# Patient Record
Sex: Male | Born: 1935 | Race: White | Hispanic: No | Marital: Married | Smoking: Former smoker
Health system: Southern US, Community
[De-identification: ages and names within clinical notes are randomized; demographics above are authoritative.]

## PROBLEM LIST (undated history)

## (undated) DIAGNOSIS — K219 Gastro-esophageal reflux disease without esophagitis: Secondary | ICD-10-CM

## (undated) DIAGNOSIS — H409 Unspecified glaucoma: Secondary | ICD-10-CM

## (undated) DIAGNOSIS — E785 Hyperlipidemia, unspecified: Secondary | ICD-10-CM

## (undated) DIAGNOSIS — I639 Cerebral infarction, unspecified: Secondary | ICD-10-CM

## (undated) DIAGNOSIS — M549 Dorsalgia, unspecified: Secondary | ICD-10-CM

## (undated) DIAGNOSIS — I4891 Unspecified atrial fibrillation: Secondary | ICD-10-CM

## (undated) DIAGNOSIS — I509 Heart failure, unspecified: Secondary | ICD-10-CM

## (undated) DIAGNOSIS — M1990 Unspecified osteoarthritis, unspecified site: Secondary | ICD-10-CM

## (undated) DIAGNOSIS — I10 Essential (primary) hypertension: Secondary | ICD-10-CM

## (undated) DIAGNOSIS — C801 Malignant (primary) neoplasm, unspecified: Secondary | ICD-10-CM

## (undated) DIAGNOSIS — I350 Nonrheumatic aortic (valve) stenosis: Secondary | ICD-10-CM

## (undated) DIAGNOSIS — N200 Calculus of kidney: Secondary | ICD-10-CM

## (undated) HISTORY — DX: Unspecified atrial fibrillation: I48.91

## (undated) HISTORY — DX: Gastro-esophageal reflux disease without esophagitis: K21.9

## (undated) HISTORY — DX: Heart failure, unspecified: I50.9

## (undated) HISTORY — PX: TRANSURETHRAL RESECTION OF PROSTATE: SHX73

## (undated) HISTORY — DX: Calculus of kidney: N20.0

## (undated) HISTORY — PX: CATARACT EXTRACTION: SUR2

## (undated) HISTORY — PX: TONSILLECTOMY: SUR1361

## (undated) HISTORY — DX: Hyperlipidemia, unspecified: E78.5

## (undated) HISTORY — DX: Essential (primary) hypertension: I10

## (undated) HISTORY — DX: Dorsalgia, unspecified: M54.9

## (undated) HISTORY — DX: Unspecified glaucoma: H40.9

## (undated) HISTORY — DX: Malignant (primary) neoplasm, unspecified: C80.1

## (undated) HISTORY — DX: Cerebral infarction, unspecified: I63.9

## (undated) HISTORY — PX: CHOLECYSTECTOMY: SHX55

## (undated) HISTORY — DX: Nonrheumatic aortic (valve) stenosis: I35.0

## (undated) HISTORY — DX: Unspecified osteoarthritis, unspecified site: M19.90

---

## 2004-10-18 ENCOUNTER — Ambulatory Visit: Payer: Self-pay | Admitting: Gastroenterology

## 2007-01-20 ENCOUNTER — Ambulatory Visit: Payer: Self-pay | Admitting: Gastroenterology

## 2010-01-30 ENCOUNTER — Inpatient Hospital Stay: Payer: Self-pay | Admitting: Internal Medicine

## 2011-03-18 ENCOUNTER — Ambulatory Visit: Payer: Self-pay | Admitting: Internal Medicine

## 2011-04-03 ENCOUNTER — Ambulatory Visit: Payer: Self-pay | Admitting: Vascular Surgery

## 2011-04-12 ENCOUNTER — Inpatient Hospital Stay: Payer: Self-pay | Admitting: Vascular Surgery

## 2011-04-17 LAB — PATHOLOGY REPORT

## 2012-04-09 ENCOUNTER — Ambulatory Visit: Payer: Self-pay | Admitting: Internal Medicine

## 2013-10-15 ENCOUNTER — Ambulatory Visit: Payer: Self-pay | Admitting: Internal Medicine

## 2014-09-29 DIAGNOSIS — I639 Cerebral infarction, unspecified: Secondary | ICD-10-CM

## 2014-09-29 HISTORY — DX: Cerebral infarction, unspecified: I63.9

## 2014-10-21 ENCOUNTER — Emergency Department: Payer: Self-pay | Admitting: Emergency Medicine

## 2014-10-21 LAB — URINALYSIS, COMPLETE
BLOOD: NEGATIVE
Bacteria: NONE SEEN
Bilirubin,UR: NEGATIVE
Glucose,UR: NEGATIVE mg/dL (ref 0–75)
Leukocyte Esterase: NEGATIVE
NITRITE: NEGATIVE
PH: 7 (ref 4.5–8.0)
Protein: NEGATIVE
SPECIFIC GRAVITY: 1.013 (ref 1.003–1.030)
SQUAMOUS EPITHELIAL: NONE SEEN
WBC UR: 1 /HPF (ref 0–5)

## 2014-10-21 LAB — CK: CK, TOTAL: 236 U/L

## 2014-10-21 LAB — CBC
HCT: 41.8 % (ref 40.0–52.0)
HGB: 13.6 g/dL (ref 13.0–18.0)
MCH: 32 pg (ref 26.0–34.0)
MCHC: 32.5 g/dL (ref 32.0–36.0)
MCV: 99 fL (ref 80–100)
Platelet: 189 10*3/uL (ref 150–440)
RBC: 4.24 10*6/uL — ABNORMAL LOW (ref 4.40–5.90)
RDW: 12.6 % (ref 11.5–14.5)
WBC: 14.1 10*3/uL — ABNORMAL HIGH (ref 3.8–10.6)

## 2014-10-21 LAB — PROTIME-INR
INR: 2.1
Prothrombin Time: 23 secs — ABNORMAL HIGH (ref 11.5–14.7)

## 2014-10-21 LAB — APTT: Activated PTT: 36 secs — ABNORMAL HIGH (ref 23.6–35.9)

## 2014-10-21 LAB — LIPASE, BLOOD: LIPASE: 133 U/L (ref 73–393)

## 2014-10-21 LAB — TROPONIN I: Troponin-I: <0.02 ng/mL

## 2014-10-21 LAB — HEPATIC FUNCTION PANEL A (ARMC): BILIRUBIN DIRECT: 0.9 mg/dL — AB (ref 0.0–0.2)

## 2014-10-25 LAB — COMPREHENSIVE METABOLIC PANEL
AST: 28 U/L (ref 15–37)
Albumin: 3.7 g/dL (ref 3.4–5.0)
Alkaline Phosphatase: 90 U/L
Anion Gap: 8 (ref 7–16)
BUN: 11 mg/dL (ref 7–18)
Bilirubin,Total: 2.4 mg/dL — ABNORMAL HIGH (ref 0.2–1.0)
CALCIUM: 8.5 mg/dL (ref 8.5–10.1)
CHLORIDE: 102 mmol/L (ref 98–107)
CO2: 27 mmol/L (ref 21–32)
CREATININE: 0.71 mg/dL (ref 0.60–1.30)
EGFR (African American): >60
EGFR (Non-African Amer.): >60
GLUCOSE: 103 mg/dL — AB (ref 65–99)
Osmolality: 273 (ref 275–301)
Potassium: 3.5 mmol/L (ref 3.5–5.1)
SGPT (ALT): 23 U/L
Sodium: 137 mmol/L (ref 136–145)
Total Protein: 6 g/dL — ABNORMAL LOW (ref 6.4–8.2)

## 2014-10-27 DIAGNOSIS — I482 Chronic atrial fibrillation: Secondary | ICD-10-CM

## 2014-10-27 DIAGNOSIS — S06360A Traumatic hemorrhage of cerebrum, unspecified, without loss of consciousness, initial encounter: Secondary | ICD-10-CM

## 2014-10-27 DIAGNOSIS — E871 Hypo-osmolality and hyponatremia: Secondary | ICD-10-CM

## 2014-10-31 DIAGNOSIS — E871 Hypo-osmolality and hyponatremia: Secondary | ICD-10-CM

## 2014-10-31 DIAGNOSIS — F0150 Vascular dementia without behavioral disturbance: Secondary | ICD-10-CM

## 2014-10-31 DIAGNOSIS — I480 Paroxysmal atrial fibrillation: Secondary | ICD-10-CM

## 2014-10-31 DIAGNOSIS — I619 Nontraumatic intracerebral hemorrhage, unspecified: Secondary | ICD-10-CM

## 2014-10-31 DIAGNOSIS — E785 Hyperlipidemia, unspecified: Secondary | ICD-10-CM

## 2014-10-31 DIAGNOSIS — I10 Essential (primary) hypertension: Secondary | ICD-10-CM

## 2014-12-06 DIAGNOSIS — I480 Paroxysmal atrial fibrillation: Secondary | ICD-10-CM

## 2014-12-06 DIAGNOSIS — F0150 Vascular dementia without behavioral disturbance: Secondary | ICD-10-CM

## 2014-12-06 DIAGNOSIS — I10 Essential (primary) hypertension: Secondary | ICD-10-CM

## 2014-12-06 DIAGNOSIS — I639 Cerebral infarction, unspecified: Secondary | ICD-10-CM

## 2014-12-06 DIAGNOSIS — E785 Hyperlipidemia, unspecified: Secondary | ICD-10-CM

## 2014-12-14 DIAGNOSIS — J069 Acute upper respiratory infection, unspecified: Secondary | ICD-10-CM

## 2014-12-27 DIAGNOSIS — J209 Acute bronchitis, unspecified: Secondary | ICD-10-CM

## 2015-01-03 ENCOUNTER — Telehealth: Payer: Self-pay

## 2015-01-03 NOTE — Telephone Encounter (Signed)
Jim at [facility] left v/m; pt is new pt at [facility]; on FL2 has brimonidine eye gtts listed; what strength should pt receive 0.15 or 0.2. Jim request cb.

## 2015-01-04 NOTE — Telephone Encounter (Signed)
I am not sure He probably needs to check with the eye doctor Can also call [facility] to see what they were giving him while he was in [facility] Let him know that I am not his physician since leaving [facility]

## 2015-01-04 NOTE — Telephone Encounter (Signed)
Spoke with Clair Gulling at [place] and advised results.

## 2015-01-30 HISTORY — PX: PTCA: SHX146

## 2015-02-13 ENCOUNTER — Ambulatory Visit: Payer: Self-pay | Admitting: Vascular Surgery

## 2015-03-06 ENCOUNTER — Inpatient Hospital Stay: Payer: Self-pay | Admitting: Internal Medicine

## 2015-03-08 ENCOUNTER — Ambulatory Visit: Payer: Medicare Other | Admitting: Internal Medicine

## 2015-03-22 ENCOUNTER — Ambulatory Visit: Payer: Self-pay | Admitting: Family

## 2015-04-24 ENCOUNTER — Ambulatory Visit
Admit: 2015-04-24 | Disposition: A | Discharge status: Home / Self Care | Payer: Self-pay | Attending: Family | Admitting: Family

## 2015-04-25 DIAGNOSIS — I5022 Chronic systolic (congestive) heart failure: Secondary | ICD-10-CM | POA: Insufficient documentation

## 2015-04-25 DIAGNOSIS — I482 Chronic atrial fibrillation, unspecified: Secondary | ICD-10-CM

## 2015-04-25 DIAGNOSIS — I4891 Unspecified atrial fibrillation: Secondary | ICD-10-CM | POA: Insufficient documentation

## 2015-04-25 DIAGNOSIS — I5032 Chronic diastolic (congestive) heart failure: Secondary | ICD-10-CM | POA: Insufficient documentation

## 2015-04-30 NOTE — Consult Note (Signed)
Present Illness 81 year old male with history of atrial fibrillation previously anticoagulated  until her AAA hemorrhagic stroke the caused this to be discontinued.  Two weeks ago he underwent percutaneous intervention the of her lower extremity lesion.  The he is a [facility] of a home place.  He was readmitted after being noted to have progressive shortness of breath and lower extremity edema.   since admission, he appears to be fairly asymptomatic.  His lower extremity edema has improved with diuresis.  He denies shortness of breath or chest pain.  Patient has a history of moderate aortic stenosis and echocardiogram done during this admission reveals a valve area is approximately 1.0 cm2 consistent with moderate aortic stenosis.  His ejection fraction is approximately  45-50%.  He has moderate aortic stenosis.  Patient has improved with diuresis.  He is resting comfortably flat in bed with no evidence of heart failure.  Chest x-ray revealed mild volume overload.  He denies any change in his medications or diet   Physical Exam:  GEN well nourished, no acute distress   HEENT PERRL   NECK No masses   RESP normal resp effort  rhonchi   CARD Irregular rate and rhythm  Murmur   Murmur Systolic   Systolic Murmur Out flow  2 to 3+ crescendo decrescendo systolic murmur radiating to the outflow tract   ABD denies tenderness  no Adominal Mass   LYMPH negative neck, negative axillae   EXTR negative cyanosis/clubbing, positive edema, bilateral lower extremity pitting edema 2 to 3+   SKIN normal to palpation   NEURO cranial nerves intact, motor/sensory function intact   PSYCH A+O to time, place, person   Review of Systems:  Subjective/Chief Complaint some shortness of breath and lower extremity edema   General: Weakness   Skin: No Complaints   ENT: No Complaints   Eyes: No Complaints   Neck: No Complaints   Respiratory: Short of breath   Cardiovascular: Edema   Gastrointestinal:  No Complaints   Genitourinary: No Complaints   Vascular: No Complaints   Musculoskeletal: No Complaints   Neurologic: No Complaints   Hematologic: No Complaints   Endocrine: No Complaints   Psychiatric: No Complaints   Review of Systems: All other systems were reviewed and found to be negative   Medications/Allergies Reviewed Medications/Allergies reviewed   Family & Social History:  Family and Social History:  Family History Non-Contributory   EKG:  Interpretation atrial fibrillation with variable ventricular response    [place]: Alt Ment Status   Impression 81 year old male with history of mild cardiomyopathy and moderate aortic stenosis who was admitted after being noted to have peripheral edema.  Patient recently underwent percutaneous intervention of  his lower extremity.  He had a history of atrial fibrillation and previously is anticoagulated  a hemorrhagic stroke resulted in discontinuing of this.  He has had no neurologic sequelae since that time.  His lower extremity edema has improved with diuresis.  He currently appears to be at his baseline.  Echocardiogram shows mild reduced LV function with evidence of septal hypokinesis.  He also has a history of aortic stenosis with moderate aortic stenosis noted on this echo.  He has ruled out for myocardial infarction.  Given his relative stability, would continue to carefully diurese and is stable on am consider discharge back to  home place.   Plan 1. Continue with careful diuresis following renal function and hemodynamics 2. Continue to rate control his atrial fibrillation.  Not a candidate for  chronic anticoagulation due to history of hemorrhagic stroke 3. low-sodium diet 4. Daily weights at his place of residence 5. continue with dual anti-platelet therapy   Electronic Signatures: Teodoro Spray (MD)  (Signed 08-Mar-16 14:06)  Authored: General Aspect/Present Illness, History and Physical Exam, Review of System,  Family & Social History, EKG , Allergies, Impression/Plan   Last Updated: 08-Mar-16 14:06 by Teodoro Spray (MD)

## 2015-04-30 NOTE — Op Note (Signed)
PATIENT NAME:  Derek Novak, CORELLA MR#:  [id number] DATE OF BIRTH:  October 04, 1922  DATE OF PROCEDURE:  02/13/2015   PREOPERATIVE DIAGNOSES:  1.  Peripheral arterial disease with ulceration, right great toe.  2.  Right toe ulceration.  3.  Hypertension.  4.  Hyperlipidemia.  5.  Valvular heart disease.  POSTOPERATIVE DIAGNOSES:    1.  Peripheral arterial disease with ulceration, right great toe.  2.  Right toe ulceration.  3.  Hypertension.  4.  Hyperlipidemia.  5.  Valvular heart disease.   PROCEDURES:  1.  Ultrasound guidance for vascular access to left femoral artery.  2.  Catheter placement to right anterior tibial and right peroneal arteries from left femoral approach.  3.  Aortogram and selective right lower extremity angiogram.  4.  Percutaneous transluminal angioplasty of long segment stenosis and occlusion of the right anterior tibial artery with 3 mm diameter angioplasty balloon.  5.  Percutaneous transluminal angioplasty of right tibioperoneal trunk and proximal and mid peroneal artery with 3 mm diameter angioplasty balloon.  6.  Percutaneous transluminal angioplasty of right distal superficial femoral artery with 6 mm diameter drug-coated angioplasty balloon, 7 and 8 mm diameter conventional angioplasty balloon.  7.  Self-expanding stent placement to distal right superficial femoral artery for greater than 50% residual stenosis after angioplasty with 7 mm diameter x 6 cm in length self-expanding stent, post dilated with a 7 mm balloon.  8.  StarClose closure device, left femoral artery.   SURGEON: Algernon Huxley, MD   ANESTHESIA: Local with moderate conscious sedation.   ESTIMATED BLOOD LOSS: 25 mL.   INDICATION FOR PROCEDURE:  This is a 81 year old gentleman who saw me in the office last week with a nonhealing ulceration of the right great toe.  This started after minimal trauma in the area, and it was not healing promptly.  No palpable pedal pulses were seen and he was referred to  our office where he was found to have an ABI of 0.4.  He is brought in for angiography for further evaluation and potential treatment.  Risks and benefits were discussed.  Informed consent was obtained.   DESCRIPTION OF PROCEDURE: The patient is brought to the vascular suite. Groins were shaved and prepped, and a sterile surgical field was created. The left femoral head was localized with fluoroscopy. The left femoral artery is visualized on ultrasound and found to be patent. It was then accessed under direct ultrasound guidance without difficulty with a Seldinger needle and a permanent image was recorded. A J-wire and 5 French sheath were placed.  Pigtail catheter was placed in the aorta at the L1 level.   AP aortogram was performed. This showed what appeared to be mild to moderate left renal artery stenosis, patent right renal artery. The aorta or iliac segments were widely patent. The aorta was slightly ectatic. I then crossed the area of bifurcation and advanced to the right femoral head and selective right lower extremity angiogram was then performed.  This showed a diffusely calcific superficial femoral, profunda femoris and common femoral artery, but without high-grade stenosis in the proximal portion of the SFA without stenosis in the common femoral or profunda femoris artery.  The distal SFA had an occlusion over about a 10 cm segment, then reconstituted Hunter's canal in the below-knee popliteal artery.  The tibial trifurcation had a normal initial orientation. However, the posterior tibial artery was chronically occluded and not visualized. The peroneal artery was occluded proximally and the tibioperoneal trunk had  multiple areas of high-grade stenosis. The anterior tibial artery also had multiple areas of stenoses and occlusion throughout the proximal and mid segments, although it was the largest runoff vessel distally.  The patient was heparinized. A 6 French Ansell sheath was placed over a Truman  advantage wire. I was able to cross the SFA occlusion easily with the [facility] advantage wire and a Kumpe catheter and confirm intraluminal flow in the popliteal artery. I then turned my attention to the tibial arteries. I exchanged for an Advantage 0.018 wire. I first got into the anterior tibial artery and crossed in multiple areas of stenoses and occlusion and got all the way into the foot. Angioplasty was then performed from the distal anterior tibial artery at ankle all the way up to beyond the origin anterior tibial artery and popliteal artery with a 3 mm diameter angioplasty balloon and multiple areas of narrowing were seen with balloon inflation, which resolved with angioplasty. Completion angiogram following this showed markedly improved flow with in-line flow into the anterior tibial artery to the foot. I then turned my attention to the peroneal artery. This was cannulated separately with a 135 angled catheter and the Advantage 0.018 wire.  I was able to cross the occlusion. The stenoses in the tibioperoneal trunk and the occlusion in the peroneal artery and park the wire into the distal peroneal artery at the ankle. The 3 mm diameter angioplasty balloon was inflated in the proximal and mid peroneal artery, up through the tibioperoneal trunk into the distal popliteal artery.  Again, narrowing was seen in multiple locations which resolved with angioplasty and completion angiogram showed excellent in-line flow through both the peroneal and anterior tibial arteries without significant residual stenosis in either vessel and markedly improved perfusion distally.   I turned my attention to the distal SFA lesion.  I initially treated with a 6 mm diameter drug-coated angioplasty balloon with a high-grade residual stenosis at the proximal occlusion spot. I tried upsizing to a 7 and even an 8 mm diameter angioplasty balloon, but the calcific lesion remained and a greater than 50% residual stenosis remained.  A 7 mm  diameter x 8 cm length self-expanding stent was then deployed, post dilated with a 7 mm balloon with an excellent angiographic completion result and no residual stenosis identified.  At this point, I elected to terminate the procedure. The sheath was removed.  StarClose closure device was deployed in the usual fashion with excellent hemostatic result. The patient tolerated the procedure well and was taken to the recovery room in stable condition.      ____________________________ Algernon Huxley, MD jsd:DT D: 02/13/2015 16:46:40 ET T: 02/13/2015 17:30:38 ET JOB#: [id number]  cc: Algernon Huxley, MD, <Dictator> Algernon Huxley MD ELECTRONICALLY SIGNED 02/22/2015 16:37

## 2015-04-30 NOTE — Discharge Summary (Signed)
PATIENT NAME:  Derek Novak, Derek Novak MR#:  [id number] DATE OF BIRTH:  28-Aug-1922  DATE OF ADMISSION:  03/06/2015 DATE OF DISCHARGE:  03/08/2015  FINAL DIAGNOSES:  1. Acute on chronic left-sided diastolic congestive heart failure.  2. Hypertension.  3. Moderate aortic valvular stenosis.  4. Atrial fibrillation.  5. Peripheral vascular disease.  6. Osteoarthritis.  7. History of intracranial hemorrhage.  8. History of glaucoma.  9. History of renal stones.  10. Gastroesophageal reflux disease.   HISTORY AND PHYSICAL: Please see dictated admission history and physical.   Speers: The patient was admitted with increasing edema, shortness of breath, and evidence of acute worsening of congestive heart failure. Prior echocardiogram had shown diastolic dysfunction, with mild to moderate aortic stenosis, this from October 2015. Cardiac enzymes were followed, which were negative. Echocardiogram revealed LVEF of 50-55%, with unchanged mild valvular stenosis. He was diuresed aggressively and did very well with this, weaned off of oxygen. Physical therapy worked with the patient and he ambulated approximately 90 feet with a rolling walker with saturations stable, which he states is approximately baseline. He had just finished up home health physical therapy at his assisted living facility.   Family members felt that he was doing well, back to his baseline level of functioning, and so at this time he will be discharged to assisted living in stable condition with his physical activity up with a rolling walker as tolerated. He should be on 2 gram sodium diet. It is recommended that he weigh himself daily, calling for more than 2 pounds gain in weight in 1 day or 5 pound gain in 1 week or increasing signs or symptoms of heart failure. He will follow up in our office within the next 1 week.   DISCHARGE MEDICATIONS:  1. Brimonidine ophthalmic solution 0.15%, 1 drop to each eye 2 times a day.   2. Tylenol 1000 mg p.o. q. 6 hours as needed for pain or fever, maximum 2000 mg daily.  3. Aspirin 81 mg p.o. daily.  4. Omeprazole 40 mg p.o. daily.  5. PreserVision 1 p.o. daily.  6. Metoprolol succinate 25 mg, half tablet p.o. b.i.d.  7. Atorvastatin 40 mg p.o. daily.  8. Losartan 50 mg p.o. daily.  9. Furosemide 40 mg p.o. daily as needed for edema.  10. Colace 100 mg p.o. b.i.d.  11. Potassium 10 mEq p.o. daily as needed with furosemide.  12.       Finasteride 5 mg p.o. daily  He will stop Kaopectate, loperamide, amlodipine, and lisinopril.    ____________________________ Adin Hector, MD bjk:bu D: 03/08/2015 12:32:23 ET T: 03/08/2015 21:19:49 ET JOB#: [id number]  cc: Adin Hector, MD, <Dictator> Ramonita Lab MD ELECTRONICALLY SIGNED 03/10/2015 8:46

## 2015-04-30 NOTE — H&P (Signed)
PATIENT NAME:  Derek Novak, Derek Novak MR#:  [id number] DATE OF BIRTH:  August 22, 1922  DATE OF ADMISSION:  03/06/2015  CHIEF COMPLAINT: Ankle swelling.   HISTORY OF PRESENT ILLNESS: This is a 81 year old male who is brought by his family today with a chief complaint of significant ankle swelling and increased dyspnea on exertion. The patient resides at [facility]," which is an assisted living facility. By way of history he had a hemorrhagic stroke in October 2015. He has chronic atrial fibrillation. He was taken off his Coumadin after his hemorrhagic stroke. He recovered fairly well without any significant deficits after the stroke. Two weeks ago he had a right leg angioplasty with stent placement, and per his family, son and daughter-in-law who are present for the interview today, he did not have any lower extremity edema at that time. Family also visited him around February 20, and at that time he did have some lower extremity edema and some wheezing. The family thought he had perhaps caught a cold that had been going around the assisted living facility at that time. The patient has notably had a 12 pound weight gain since mid January, and over the last week or so has had significantly increased dyspnea on exertion, even some with minimal exertion, with wheezing. The patient does not have a known diagnosis of heart failure. Here in the ED his BNP was found to be significantly elevated at 13,000, and at this point, hospitalists were called for admission.   PRIMARY CARE PHYSICIAN: Adin Hector, MD with [facility].   PATIENT'S CARDIOLOGIST: Corey Skains, MD also with [facility].   PAST MEDICAL HISTORY: Aortic valve stenosis, hypertension, chronic back pain, kidney stones, glaucoma, hyperlipidemia, GERD, internal hemorrhoids, chronic atrial fibrillation.   CURRENT MEDICATIONS: Tylenol 500 mg 2 tabs q. 6 p.r.n., Prilosec 40 mg daily, Toprol-XL 12.5 mg b.i.d., lisinopril 5 mg daily, brimonidine  ophthalmologic solution 0.15% one drop to each eye daily, Lipitor 40 mg daily, aspirin 81 mg daily, Norvasc 5 mg daily.   PAST SURGICAL HISTORY: Includes TURP, tonsillectomy, cholecystectomy, and cataract removal.   ALLERGIES: QUINIDINE.   FAMILY HISTORY: Includes coronary artery disease and cancer.   SOCIAL HISTORY: The patient is an ex-smoker. He quit smoking 40 years ago. He is a nondrinker. Denies any illicit drug use.   REVIEW OF SYSTEMS:   CONSTITUTIONAL: No fever, fatigue, or weakness.  EYES: No blurred or double vision, pain or redness.  EAR, NOSE, AND THROAT: No ear pain, hearing loss, or difficulty swallowing.  RESPIRATORY: No cough. He does have wheeze and dyspnea on exertion.  CARDIOVASCULAR: No chest pain, orthopnea. He does have lower extremity edema.  GASTROINTESTINAL: No nausea, vomiting, diarrhea. No abdominal pain. No constipation.  GENITOURINARY: No dysuria, hematuria, or frequency.  ENDOCRINE: No nocturia, thyroid problems, heat or cold intolerance.  HEMATOLOGIC AND LYMPHATIC: No easy bruising. No bleeding or swollen glands.  INTEGUMENTARY: No acne, rash, or lesion.  MUSCULOSKELETAL: No arthritis, joint swelling, or gout.  NEUROLOGIC: No numbness, weakness, or headache.  PSYCHIATRIC: No anxiety, insomnia, depression.   PHYSICAL EXAMINATION:  VITAL SIGNS: Blood pressure 154/78, pulse 78, temperature 97.6, respirations 20, 99% O2 saturations on room air.  GENERAL: This is a well-nourished, elderly gentleman in no apparent distress lying supine in bed.  HEENT: Pupils equal, round, and reactive to light. Extraocular movements intact. No scleral icterus. Moist mucosal membranes.  NECK: Thyroid is within normal limits, is  not enlarged. His neck is supple with no masses and  is nontender. No cervical adenopathy. The patient does have mild JVD.  RESPIRATORY: Clear to auscultation except for mild fine bibasilar rales. No rhonchi or wheezing. No respiratory distress.   CARDIOVASCULAR: Irregular rhythm, regular rate. He has a 3/6 systolic ejection murmur heard loudest at the left upper sternal border. He has no rubs or gallops auscultated. He has 2+ bilateral lower extremity edema.  ABDOMEN: Soft, nontender, nondistended with good bowel sounds. No hepatosplenomegaly.  MUSCULOSKELETAL: He has full spontaneous range of motion throughout with 5/5 muscular strength in all 4 extremities. No cyanosis or clubbing.  SKIN: No rash, lesions. His skin is warm, dry, and intact.  LYMPHATIC: No adenopathy.  NEUROLOGIC: Cranial nerves intact. Sensation intact throughout. No dysarthria or aphasia.  PSYCHIATRIC: Alert and oriented x 3, cooperative with good insight.   LABORATORY DATA: White count 9.2, hemoglobin 11.5, hematocrit 34, platelets 213. Sodium 135, potassium 4.7, chloride 101, bicarbonate 26, BUN 10, creatinine 0.82, glucose 93. Troponin 0.03. BNP 13,648. INR 1.2.   RADIOLOGY: Chest x-ray showed interstitial edema with tiny bilateral pleural effusions.   ASSESSMENT AND PLAN:  1.  Acute congestive heart failure. The patient got IV Lasix in the ED with good diuretic effect. He should be reassessed after this dose of Lasix to see if he needs more IV diuretic or if he can be transitioned to oral tomorrow. We have ordered an echocardiogram, also a cardiology consult for Dr. Alveria Apley practice. The patient is already on an ACE inhibitor and statin.  2.  Aortic stenosis. The echo will also re-evaluate this. Question whether it may be contributing to his current heart failure.  3.  Chronic atrial fibrillation. He is currently rate controlled. He is not on any anticoagulation due to his recent hemorrhagic stroke. Continue his rate controlling medication here.  4.  Hypertension. This problem is decently controlled with the patient's home medications. We will continue his medications here.  5.  Gastroesophageal reflux disease. This is stable with patient's PPI. We will continue  PPI here.  6.  Deep vein thrombosis prophylaxis. Subcutaneous heparin.   CODE STATUS: This patient is full code.   TIME SPENT ON THIS ADMISSION: 50 minutes.     ____________________________ Wilford Corner. Jannifer Franklin, MD dfw:AT D: 03/07/2015 01:39:58 ET T: 03/07/2015 02:43:34 ET JOB#: [id number]  cc: Wilford Corner. Jannifer Franklin, MD, <Dictator> Orpha Dain Fawn Kirk MD ELECTRONICALLY SIGNED 03/07/2015 3:37

## 2015-06-26 ENCOUNTER — Other Ambulatory Visit: Payer: Self-pay | Admitting: Internal Medicine

## 2015-07-11 ENCOUNTER — Other Ambulatory Visit: Payer: Self-pay | Admitting: Internal Medicine

## 2015-07-25 ENCOUNTER — Other Ambulatory Visit: Payer: Self-pay | Admitting: Urology

## 2015-07-25 ENCOUNTER — Other Ambulatory Visit: Payer: Self-pay | Admitting: Internal Medicine

## 2015-08-23 ENCOUNTER — Other Ambulatory Visit: Payer: Self-pay | Admitting: Internal Medicine

## 2016-02-13 ENCOUNTER — Other Ambulatory Visit: Payer: Self-pay | Admitting: Internal Medicine

## 2016-12-16 ENCOUNTER — Inpatient Hospital Stay: Payer: Medicare Other

## 2016-12-16 ENCOUNTER — Inpatient Hospital Stay
Admission: EM | Admit: 2016-12-16 | Discharge: 2016-12-30 | DRG: 378 | Disposition: E | Payer: Medicare Other | Attending: Internal Medicine | Admitting: Internal Medicine

## 2016-12-16 DIAGNOSIS — I350 Nonrheumatic aortic (valve) stenosis: Secondary | ICD-10-CM | POA: Diagnosis present

## 2016-12-16 DIAGNOSIS — M1990 Unspecified osteoarthritis, unspecified site: Secondary | ICD-10-CM | POA: Diagnosis present

## 2016-12-16 DIAGNOSIS — I5022 Chronic systolic (congestive) heart failure: Secondary | ICD-10-CM | POA: Diagnosis present

## 2016-12-16 DIAGNOSIS — K922 Gastrointestinal hemorrhage, unspecified: Secondary | ICD-10-CM | POA: Diagnosis present

## 2016-12-16 DIAGNOSIS — I255 Ischemic cardiomyopathy: Secondary | ICD-10-CM | POA: Diagnosis present

## 2016-12-16 DIAGNOSIS — E860 Dehydration: Secondary | ICD-10-CM | POA: Diagnosis present

## 2016-12-16 DIAGNOSIS — Z7982 Long term (current) use of aspirin: Secondary | ICD-10-CM

## 2016-12-16 DIAGNOSIS — I482 Chronic atrial fibrillation: Secondary | ICD-10-CM | POA: Diagnosis present

## 2016-12-16 DIAGNOSIS — Z66 Do not resuscitate: Secondary | ICD-10-CM | POA: Diagnosis present

## 2016-12-16 DIAGNOSIS — H9193 Unspecified hearing loss, bilateral: Secondary | ICD-10-CM | POA: Diagnosis present

## 2016-12-16 DIAGNOSIS — Z87891 Personal history of nicotine dependence: Secondary | ICD-10-CM

## 2016-12-16 DIAGNOSIS — Z800 Family history of malignant neoplasm of digestive organs: Secondary | ICD-10-CM

## 2016-12-16 DIAGNOSIS — I472 Ventricular tachycardia: Secondary | ICD-10-CM | POA: Diagnosis not present

## 2016-12-16 DIAGNOSIS — H409 Unspecified glaucoma: Secondary | ICD-10-CM | POA: Diagnosis present

## 2016-12-16 DIAGNOSIS — E785 Hyperlipidemia, unspecified: Secondary | ICD-10-CM | POA: Diagnosis present

## 2016-12-16 DIAGNOSIS — K921 Melena: Principal | ICD-10-CM

## 2016-12-16 DIAGNOSIS — K219 Gastro-esophageal reflux disease without esophagitis: Secondary | ICD-10-CM | POA: Diagnosis present

## 2016-12-16 DIAGNOSIS — I110 Hypertensive heart disease with heart failure: Secondary | ICD-10-CM | POA: Diagnosis present

## 2016-12-16 DIAGNOSIS — Z8673 Personal history of transient ischemic attack (TIA), and cerebral infarction without residual deficits: Secondary | ICD-10-CM | POA: Diagnosis not present

## 2016-12-16 DIAGNOSIS — K625 Hemorrhage of anus and rectum: Secondary | ICD-10-CM

## 2016-12-16 DIAGNOSIS — M6281 Muscle weakness (generalized): Secondary | ICD-10-CM

## 2016-12-16 DIAGNOSIS — Z79899 Other long term (current) drug therapy: Secondary | ICD-10-CM

## 2016-12-16 DIAGNOSIS — Z888 Allergy status to other drugs, medicaments and biological substances status: Secondary | ICD-10-CM

## 2016-12-16 DIAGNOSIS — I509 Heart failure, unspecified: Secondary | ICD-10-CM

## 2016-12-16 DIAGNOSIS — R262 Difficulty in walking, not elsewhere classified: Secondary | ICD-10-CM

## 2016-12-16 LAB — COMPREHENSIVE METABOLIC PANEL
ALBUMIN: 3.8 g/dL (ref 3.5–5.0)
ALK PHOS: 107 U/L (ref 38–126)
ALT: 13 U/L — AB (ref 17–63)
AST: 22 U/L (ref 15–41)
Anion gap: 7 (ref 5–15)
BUN: 16 mg/dL (ref 6–20)
CALCIUM: 8.8 mg/dL — AB (ref 8.9–10.3)
CO2: 25 mmol/L (ref 22–32)
CREATININE: 1.01 mg/dL (ref 0.61–1.24)
Chloride: 104 mmol/L (ref 101–111)
GFR calc Af Amer: >60 mL/min (ref >60)
GFR calc non Af Amer: >60 mL/min (ref >60)
GLUCOSE: 122 mg/dL — AB (ref 65–99)
Potassium: 4.6 mmol/L (ref 3.5–5.1)
SODIUM: 136 mmol/L (ref 135–145)
Total Bilirubin: 1.1 mg/dL (ref 0.3–1.2)
Total Protein: 6.2 g/dL — ABNORMAL LOW (ref 6.5–8.1)

## 2016-12-16 LAB — CBC
HCT: 39.4 % — ABNORMAL LOW (ref 40.0–52.0)
HEMOGLOBIN: 12.7 g/dL — AB (ref 13.0–18.0)
MCH: 28.6 pg (ref 26.0–34.0)
MCHC: 32.3 g/dL (ref 32.0–36.0)
MCV: 88.4 fL (ref 80.0–100.0)
PLATELETS: 130 10*3/uL — AB (ref 150–440)
RBC: 4.46 MIL/uL (ref 4.40–5.90)
RDW: 15.6 % — AB (ref 11.5–14.5)
WBC: 6.9 10*3/uL (ref 3.8–10.6)

## 2016-12-16 LAB — PROTIME-INR
INR: 1.14
Prothrombin Time: 14.7 seconds (ref 11.4–15.2)

## 2016-12-16 LAB — TYPE AND SCREEN
ABO/RH(D): A POS
Antibody Screen: NEGATIVE

## 2016-12-16 LAB — LIPASE, BLOOD: Lipase: 25 U/L (ref 11–51)

## 2016-12-16 LAB — HEMOGLOBIN: Hemoglobin: 12.8 g/dL — ABNORMAL LOW (ref 13.0–18.0)

## 2016-12-16 LAB — APTT: APTT: 35 s (ref 24–36)

## 2016-12-16 MED ORDER — ACETAMINOPHEN 650 MG RE SUPP: 650.0000 mg | Freq: Four times a day (QID) | RECTAL | Status: DC | PRN

## 2016-12-16 MED ORDER — TECHNETIUM TC 99M-LABELED RED BLOOD CELLS IV KIT
20.2220 | PACK | Freq: Once | INTRAVENOUS | Status: AC | PRN
  Administered 2016-12-16: 20.222 via INTRAVENOUS

## 2016-12-16 MED ORDER — PANTOPRAZOLE SODIUM 40 MG IV SOLR: 40.0000 mg | Freq: Two times a day (BID) | INTRAVENOUS | Status: DC

## 2016-12-16 MED ORDER — SODIUM CHLORIDE 0.9 % IV SOLN
INTRAVENOUS | Status: AC
  Administered 2016-12-17: 01:00:00 via INTRAVENOUS

## 2016-12-16 MED ORDER — SODIUM CHLORIDE 0.9 % IV SOLN
8.0000 mg/h | INTRAVENOUS | Status: DC
  Administered 2016-12-16 – 2016-12-17 (×3): 8 mg/h via INTRAVENOUS
  Filled 2016-12-16 (×4): qty 80

## 2016-12-16 MED ORDER — SODIUM CHLORIDE 0.9% FLUSH
3.0000 mL | Freq: Two times a day (BID) | INTRAVENOUS | Status: DC
  Administered 2016-12-17 (×2): 3 mL via INTRAVENOUS

## 2016-12-16 MED ORDER — AZELASTINE HCL 0.1 % NA SOLN
1.0000 | Freq: Two times a day (BID) | NASAL | Status: DC
  Administered 2016-12-17 (×2): 1 via NASAL
  Filled 2016-12-16: qty 30

## 2016-12-16 MED ORDER — SODIUM CHLORIDE 0.9 % IV SOLN
80.0000 mg | Freq: Once | INTRAVENOUS | Status: AC
  Administered 2016-12-16: 80 mg via INTRAVENOUS
  Filled 2016-12-16: qty 80

## 2016-12-16 MED ORDER — METOPROLOL SUCCINATE ER 25 MG PO TB24
12.5000 mg | ORAL_TABLET | Freq: Two times a day (BID) | ORAL | Status: DC
  Administered 2016-12-17 (×3): 12.5 mg via ORAL
  Filled 2016-12-16 (×3): qty 1

## 2016-12-16 MED ORDER — ACETAMINOPHEN 325 MG PO TABS: 650.0000 mg | ORAL_TABLET | Freq: Four times a day (QID) | ORAL | Status: DC | PRN

## 2016-12-16 MED ORDER — OCUVITE-LUTEIN PO CAPS
1.0000 | ORAL_CAPSULE | Freq: Every day | ORAL | Status: DC
  Administered 2016-12-17: 1 via ORAL
  Filled 2016-12-16: qty 1

## 2016-12-16 MED ORDER — BRIMONIDINE TARTRATE 0.2 % OP SOLN
1.0000 [drp] | Freq: Two times a day (BID) | OPHTHALMIC | Status: DC
  Administered 2016-12-17 (×3): 1 [drp] via OPHTHALMIC
  Filled 2016-12-16: qty 5

## 2016-12-16 MED ORDER — ONDANSETRON HCL 4 MG/2ML IJ SOLN
4.0000 mg | Freq: Four times a day (QID) | INTRAMUSCULAR | Status: DC | PRN
  Administered 2016-12-16: 4 mg via INTRAVENOUS

## 2016-12-16 MED ORDER — BENZONATATE 100 MG PO CAPS: 100.0000 mg | ORAL_CAPSULE | Freq: Three times a day (TID) | ORAL | Status: DC | PRN

## 2016-12-16 MED ORDER — VITAMIN B-12 1000 MCG PO TABS
1000.0000 ug | ORAL_TABLET | Freq: Every day | ORAL | Status: DC
  Administered 2016-12-17: 1000 ug via ORAL
  Filled 2016-12-16: qty 1

## 2016-12-16 MED ORDER — ONDANSETRON HCL 4 MG PO TABS: 4.0000 mg | ORAL_TABLET | Freq: Four times a day (QID) | ORAL | Status: DC | PRN

## 2016-12-16 MED ORDER — MORPHINE SULFATE (PF) 4 MG/ML IV SOLN
1.0000 mg | INTRAVENOUS | Status: AC
  Administered 2016-12-16: 1 mg via INTRAVENOUS
  Filled 2016-12-16: qty 1

## 2016-12-16 MED ORDER — LOPERAMIDE HCL 2 MG PO CAPS: 2.0000 mg | ORAL_CAPSULE | ORAL | Status: DC | PRN

## 2016-12-16 MED ORDER — ONDANSETRON HCL 4 MG/2ML IJ SOLN
INTRAMUSCULAR | Status: AC
  Administered 2016-12-16: 4 mg via INTRAVENOUS
  Filled 2016-12-16: qty 2

## 2016-12-16 MED ORDER — MORPHINE SULFATE (PF) 4 MG/ML IV SOLN: 1.0000 mg | INTRAVENOUS | Status: DC | PRN

## 2016-12-16 MED ORDER — MORPHINE SULFATE (PF) 2 MG/ML IV SOLN
INTRAVENOUS | Status: AC
  Filled 2016-12-16: qty 1

## 2016-12-16 NOTE — Clinical Social Work Note (Signed)
Clinical Social Work Assessment  Patient Details  Name: Derek Novak MRN: [id number] Date of Birth: Jun 16, 1922  Date of referral:  12/02/2016               Reason for consult:  Other (Comment Required) (From [facility] )                Permission sought to share information with:  Facility Art therapist granted to share information::  Yes, Verbal Permission Granted  Name::      [facility]   Agency::     Relationship::     Contact Information:     Housing/Transportation Living arrangements for the past 2 months:  [place] of Information:  Adult Children, Facility, Power of Attorney Patient Interpreter Needed:  None Criminal Activity/Legal Involvement Pertinent to Current Situation/Hospitalization:  No - Comment as needed Significant Relationships:  Adult Children Lives with:  Facility Resident Do you feel safe going back to the place where you live?    Need for family participation in patient care:  Yes (Comment)  Care giving concerns:  Patient is a long term care resident at [facility].    Social Worker assessment / plan:  Holiday representative (CSW) received consult that patient is from [facility]. Per chart patient is being admitted to the medical floor from the ED for rectal bleeding. CSW contacted [facility] administrator to discuss case. Per Horris Latino patient is a private pay resident and has been at [facility] for 2 years. Per Horris Latino patient's wife Elvin So has passed away. Per Horris Latino patient walks with a walker at baseline and is very independent. Per [facility] staff assist with bathing and medications. Per Horris Latino patient is on room air at baseline and son Herbie Baltimore is main contact. Per Horris Latino patient can return to [facility] when stable. CSW contacted patient's son Herbie Baltimore who handed the phone to his wife Stanton Kidney (patient's daughter in law). Per Stanton Kidney she and Herbie Baltimore have dual POA and HPOA. Per Stanton Kidney patient had  hospice services in October 2017 and was doing well and discharged from their services. Per Stanton Kidney she or her husband can provide transport for patient. Stanton Kidney and Herbie Baltimore are agreeable for patient to return to [facility] when stable.    Employment status:  Retired Forensic scientist:  Medicare PT Recommendations:  Not assessed at this time [place] / Referral to community resources:  Other (Comment Required) (Patient's family would like for him to return to [facility] )  Patient/Family's Response to care:  Patient's family is agreeable for patient to return to [place] when stable.   Patient/Family's Understanding of and Emotional Response to Diagnosis, Current Treatment, and Prognosis:  Patient's son and daughter in law were very pleasant and thanked CSW for assistance.   Emotional Assessment Appearance:  Appears stated age Attitude/Demeanor/Rapport:  Unable to Assess Affect (typically observed):  Unable to Assess Orientation:  Oriented to Self, Fluctuating Orientation (Suspected and/or reported Sundowners), Oriented to Place, Oriented to  Time Alcohol / Substance use:  Not Applicable Psych involvement (Current and /or in the community):  No (Comment)  Discharge Needs  Concerns to be addressed:  Discharge Planning Concerns Readmission within the last 30 days:  No Current discharge risk:  Chronically ill Barriers to Discharge:  Continued Medical Work up   [employer], Veronia Beets, LCSW 12/08/2016, 3:36 PM

## 2016-12-16 NOTE — H&P (Addendum)
[facility] NAME: Derek Novak    MR#:  [id number]  DATE OF BIRTH:  10/01/1922  DATE OF ADMISSION:  12/09/2016  PRIMARY CARE PHYSICIAN: Adin Hector, MD   REQUESTING/REFERRING PHYSICIAN: Dr. Hinda Kehr  CHIEF COMPLAINT:   Chief Complaint  Patient presents with  . Rectal Bleeding    HISTORY OF PRESENT ILLNESS:  Derek Novak  is a 82 y.o. male with a known history of Congestive heart failure with EF 20%, aortic stenosis, chronic atrial fibrillation not on anticoagulation due to history of intracranial bleed, hypertension, hyperlipidemia presents from assisted living facility secondary to melena. Patient is significantly hard of hearing, strength history obtained from his son at bedside. Patient's baseline hemoglobin is around 12. Today he was complaining of upper abdominal pain associated with melena. He had 2 more episodes of dark bloody stools and was strongly positive for guaiac in the emergency room. His blood pressure is within normal limits and actually higher, not tachycardic yet. Hb still at 12. Recent bronchitis and was treated with levaquin. He feels extremely weak and fatigued.  PAST MEDICAL HISTORY:   Past Medical History:  Diagnosis Date  . Aortic valvar stenosis   . Atrial fibrillation ([place])   . Back pain   . Cancer ([place])    skin  . CHF (congestive heart failure) ([place])   . GERD (gastroesophageal reflux disease)   . Glaucoma   . Hyperlipidemia   . Hypertension   . Kidney stone   . Osteoarthritis   . Stroke [facility]) October 2015    PAST SURGICAL HISTORY:   Past Surgical History:  Procedure Laterality Date  . CATARACT EXTRACTION    . CHOLECYSTECTOMY    . PTCA Right Feb 2016   right thigh  . TONSILLECTOMY    . TRANSURETHRAL RESECTION OF PROSTATE      SOCIAL HISTORY:   Social History  Substance Use Topics  . Smoking status: Former Smoker    Quit date: 04/25/1975  . Smokeless tobacco: Never  Used  . Alcohol use No    FAMILY HISTORY:   Family History  Problem Relation Age of Onset  . Colon cancer Father     DRUG ALLERGIES:   Allergies  Allergen Reactions  . Quinidine Other (See Comments)    Altered mental status    REVIEW OF SYSTEMS:   Review of Systems  Constitutional: Positive for malaise/fatigue. Negative for chills, fever and weight loss.  HENT: Positive for hearing loss. Negative for ear discharge, ear pain, nosebleeds and tinnitus.   Eyes: Negative for blurred vision, double vision and photophobia.  Respiratory: Positive for shortness of breath. Negative for cough, hemoptysis and wheezing.   Cardiovascular: Negative for chest pain, palpitations, orthopnea and leg swelling.  Gastrointestinal: Positive for abdominal pain, blood in stool and melena. Negative for constipation, diarrhea, heartburn, nausea and vomiting.  Genitourinary: Negative for dysuria, frequency and urgency.  Musculoskeletal: Positive for back pain. Negative for myalgias and neck pain.  Skin: Negative for rash.  Neurological: Negative for dizziness, tingling, tremors, sensory change, speech change and focal weakness.  Endo/Heme/Allergies: Does not bruise/bleed easily.  Psychiatric/Behavioral: Negative for depression.    MEDICATIONS AT HOME:   Prior to Admission medications   Medication Sig Start Date End Date Taking? Authorizing Provider  acetaminophen (TYLENOL) 500 MG tablet Take 1,000 mg by mouth 2 (two) times daily.    Yes Historical Provider, MD  aspirin 81 MG tablet Take 81 mg  by mouth daily.   Yes Historical Provider, MD  azelastine (ASTELIN) 0.1 % nasal spray Place 1 spray into both nostrils 2 (two) times daily. Use in each nostril as directed   Yes Historical Provider, MD  benzonatate (TESSALON) 100 MG capsule Take 100 mg by mouth 3 (three) times daily as needed for cough.   Yes Historical Provider, MD  brimonidine (ALPHAGAN) 0.2 % ophthalmic solution Place 1 drop into both eyes 2  (two) times daily.   Yes Historical Provider, MD  cyanocobalamin 1000 MCG tablet Take 1,000 mcg by mouth daily.   Yes Historical Provider, MD  docusate sodium (COLACE) 100 MG capsule Take 100 mg by mouth 2 (two) times daily.   Yes Historical Provider, MD  levofloxacin (LEVAQUIN) 250 MG tablet Take 250 mg by mouth daily. For 10 days. 12/05/16  Yes Historical Provider, MD  loperamide (IMODIUM) 2 MG capsule Take 2 mg by mouth as needed for diarrhea or loose stools.   Yes Historical Provider, MD  losartan (COZAAR) 25 MG tablet Take 25 mg by mouth daily.    Yes Historical Provider, MD  metoprolol succinate (TOPROL-XL) 25 MG 24 hr tablet Take 12.5 mg by mouth 2 (two) times daily.   Yes Historical Provider, MD  Multiple Vitamins-Minerals (PRESERVISION AREDS 2) CAPS Take 1 capsule by mouth daily.   Yes Historical Provider, MD  omeprazole (PRILOSEC) 20 MG capsule Take 20 mg by mouth daily.    Yes Historical Provider, MD  potassium chloride (K-DUR) 10 MEQ tablet Take 10 mEq by mouth daily as needed (with furosemide).   Yes Historical Provider, MD  furosemide (LASIX) 40 MG tablet Take 40 mg by mouth daily as needed for fluid or edema.    Historical Provider, MD      VITAL SIGNS:  Blood pressure (!) 157/77, pulse 78, temperature 97.9 F (36.6 C), temperature source Oral, resp. rate 11, height 5\' 11"  (1.803 m), weight 77.1 kg (170 lb), SpO2 99 %.  PHYSICAL EXAMINATION:   Physical Exam  GENERAL:  82 y.o.-year-old patient lying in the bed with no acute distress. Hard of hearing EYES: Pupils equal, round, reactive to light and accommodation. No scleral icterus. Extraocular muscles intact.  HEENT: Head atraumatic, normocephalic. Oropharynx and nasopharynx clear.  NECK:  Supple, no jugular venous distention. No thyroid enlargement, no tenderness.  LUNGS: Normal breath sounds bilaterally, no wheezing, rales,rhonchi or crepitation. No use of accessory muscles of respiration. Decreased bibasilar breath  sounds CARDIOVASCULAR: S1, S2 normal. No rubs, or gallops. Loud 3/6 systolic murmur present. ABDOMEN: Soft, nontender, nondistended. Bowel sounds present. No organomegaly or mass.  EXTREMITIES: No pedal edema, cyanosis, or clubbing.  NEUROLOGIC: Cranial nerves II through XII are intact. Muscle strength 5/5 in all extremities. Sensation intact. Gait not checked.  PSYCHIATRIC: The patient is alert and oriented x 3.  SKIN: No obvious rash, lesion, or ulcer.   LABORATORY PANEL:   CBC  Recent Labs Lab 12/07/2016 1133  WBC 6.9  HGB 12.7*  HCT 39.4*  PLT 130*   ------------------------------------------------------------------------------------------------------------------  Chemistries   Recent Labs Lab 12/14/2016 1133  NA 136  K 4.6  CL 104  CO2 25  GLUCOSE 122*  BUN 16  CREATININE 1.01  CALCIUM 8.8*  AST 22  ALT 13*  ALKPHOS 107  BILITOT 1.1   ------------------------------------------------------------------------------------------------------------------  Cardiac Enzymes No results for input(s): TROPONINI in the last 168 hours. ------------------------------------------------------------------------------------------------------------------  RADIOLOGY:  No results found.  EKG:   Orders placed or performed in visit on 10/21/14  .  EKG 12-Lead    IMPRESSION AND PLAN:   Derek Novak  is a 82 y.o. male with a known history of Congestive heart failure with EF 20%, aortic stenosis, chronic atrial fibrillation not on anticoagulation due to history of intracranial bleed, hypertension, hyperlipidemia presents from assisted living facility secondary to melena.  #1 GI bleed-with melena and upper abdominal pain, likely upper GI bleed. -Hold aspirin. Hasn't been on Coumadin for 2 years now due to intracranial bleed. -Admit, IV Protonix. Check hemoglobin every 8 hours. -GI consult. Keep him nothing by mouth. -Due to active bleeding, will monitor and step down and also  ordered a nuclear medicine bleeding scan.  #2 chronic atrial fibrillation-rate controlled. Continue low-dose metoprolol and Cardizem -Hold aspirin for now.  #3 ischemic cardiomyopathy-has been diuresed and Lasix dose has been increased as outpatient 2 weeks ago when he started complaining of shortness of breath. Also was treated for bronchitis with Levaquin. Currently appears dehydrated. Hold Lasix and gentle hydration for a few hours.  #4 hypertension-continue home medications.  #5 DVT prophylaxis-Ted's and SCDs. No heparin products due to GI bleed    All the records are reviewed and case discussed with ED provider. Management plans discussed with the patient, family and they are in agreement.  CODE STATUS: DNR  TOTAL CRITICAL CARE TIME TAKING CARE OF THIS PATIENT: 60 minutes.    Gladstone Lighter M.D on 12/17/2016 at 1:58 PM  Between 7am to 6pm - Pager - [phone]  After 6pm go to [email or web address] - password [email or web address] [facility]  Office  [phone]  CC: Primary care physician; Adin Hector, MD

## 2016-12-16 NOTE — NC FL2 (Signed)
[place] LEVEL OF CARE SCREENING TOOL     IDENTIFICATION  Patient Name: Derek Novak Birthdate: 10-31-1922 Sex: male Admission Date (Current Location): 12/23/2016  [place] and [place] Number:  Engineering geologist and Address:  [facility], [address] [place], [place] [address]      Provider Number: [id number]  Attending Physician Name and Address:  Gladstone Lighter, MD  Relative Name and Phone Number:       Current Level of Care: Hospital Recommended Level of Care: [place] Prior Approval Number:    Date Approved/Denied:   PASRR Number:   [id number]   Discharge Plan: Other (Comment) (ALF)    Current Diagnoses: Patient Active Problem List   Diagnosis Date Noted  . GI bleed 12/27/2016  . Atrial fibrillation ([place]) 04/25/2015  . Chronic systolic heart failure ([place]) 04/25/2015    Orientation RESPIRATION BLADDER Height & Weight     Self, Time, Situation, Place  Normal Continent Weight: 170 lb (77.1 kg) Height:  5\' 11"  (180.3 cm)  BEHAVIORAL SYMPTOMS/MOOD NEUROLOGICAL BOWEL NUTRITION STATUS      Continent Diet (Regular )  AMBULATORY STATUS COMMUNICATION OF NEEDS Skin   Limited Assist Verbally Normal                       Personal Care Assistance Level of Assistance  Bathing, Feeding, Dressing Bathing Assistance: Limited assistance Feeding assistance: Independent Dressing Assistance: Limited assistance     Functional Limitations Info  Sight, Hearing, Speech Sight Info: Adequate Hearing Info: Impaired (Hard of hearing) Speech Info: Adequate    SPECIAL CARE FACTORS FREQUENCY                       Contractures Contractures Info: Not present    Additional Factors Info  Code Status, Allergies Code Status Info: DNR Allergies Info: Quinidine            Current Medications (12/07/2016):  This is the current hospital active medication list Current Facility-Administered  Medications  Medication Dose Route Frequency Provider Last Rate Last Dose  . pantoprazole (PROTONIX) 80 mg in sodium chloride 0.9 % 100 mL IVPB  80 mg Intravenous Once Gladstone Lighter, MD      . pantoprazole (PROTONIX) 80 mg in sodium chloride 0.9 % 250 mL (0.32 mg/mL) infusion  8 mg/hr Intravenous Continuous Gladstone Lighter, MD      . Derrill Memo ON 12/20/2016] pantoprazole (PROTONIX) injection 40 mg  40 mg Intravenous Q12H Gladstone Lighter, MD       Current Outpatient Prescriptions  Medication Sig Dispense Refill  . acetaminophen (TYLENOL) 500 MG tablet Take 1,000 mg by mouth 2 (two) times daily.     Marland Kitchen aspirin 81 MG tablet Take 81 mg by mouth daily.    Marland Kitchen azelastine (ASTELIN) 0.1 % nasal spray Place 1 spray into both nostrils 2 (two) times daily. Use in each nostril as directed    . benzonatate (TESSALON) 100 MG capsule Take 100 mg by mouth 3 (three) times daily as needed for cough.    . brimonidine (ALPHAGAN) 0.2 % ophthalmic solution Place 1 drop into both eyes 2 (two) times daily.    . cyanocobalamin 1000 MCG tablet Take 1,000 mcg by mouth daily.    Marland Kitchen docusate sodium (COLACE) 100 MG capsule Take 100 mg by mouth 2 (two) times daily.    Marland Kitchen levofloxacin (LEVAQUIN) 250 MG tablet Take 250 mg by mouth daily. For 10 days.    Marland Kitchen  loperamide (IMODIUM) 2 MG capsule Take 2 mg by mouth as needed for diarrhea or loose stools.    Marland Kitchen losartan (COZAAR) 25 MG tablet Take 25 mg by mouth daily.     . metoprolol succinate (TOPROL-XL) 25 MG 24 hr tablet Take 12.5 mg by mouth 2 (two) times daily.    . Multiple Vitamins-Minerals (PRESERVISION AREDS 2) CAPS Take 1 capsule by mouth daily.    Marland Kitchen omeprazole (PRILOSEC) 20 MG capsule Take 20 mg by mouth daily.     . potassium chloride (K-DUR) 10 MEQ tablet Take 10 mEq by mouth daily as needed (with furosemide).    . furosemide (LASIX) 40 MG tablet Take 40 mg by mouth daily as needed for fluid or edema.       Discharge Medications: Please see discharge summary for a  list of discharge medications.  Relevant Imaging Results:  Relevant Lab Results:   Additional Information [id number]  Georga Kaufmann, LCSWA

## 2016-12-16 NOTE — Progress Notes (Signed)
Per chart patient is from [facility]. Clinical Education officer, museum (CSW) attempted to Radio broadcast assistant at [employer] however she did not answer and a voicemail was left.   [employer], LCSW [phone]

## 2016-12-16 NOTE — ED Notes (Addendum)
This RN called pharmacy to check on Protonix drip. Pharmacy reports they will sent the medication as soon as possible, they are currently mixing medication drip.

## 2016-12-16 NOTE — ED Notes (Addendum)
RN attempted to call report to ICU and was told by charge that MD had stated this mornign that pt was supposed to be going to the floor. RN paged MD to figure out bed placement for pt.

## 2016-12-16 NOTE — ED Triage Notes (Signed)
Pt to ED from [facility] c/o rectal bleeding. Per EMS staff reported pt had noticeable "marroon colored stools" this AM after his bowel movement, with no previous hx of GI bleed. Pt c/o RLQ tenderness and denies NVD. Pt alert and oriented, in no acute distress at this time.

## 2016-12-16 NOTE — Progress Notes (Addendum)
No stepdown beds per charge nurse in ICU. Patient has had about 3-4 bloody stools in ER Hemodynamically stable for now GI bleeding scan is negative. Stat hemoglobin is pending- if stable- will change to regular med/surg bed

## 2016-12-16 NOTE — ED Notes (Addendum)
NM called this RN and reported pt has started vomiting in NM scan. RN to room to administer PRN medication. Pt in NAD upon assessment and will continue to complete NM scan.

## 2016-12-16 NOTE — ED Provider Notes (Signed)
[facility] Emergency Department Provider Note  ____________________________________________   First MD Initiated Contact with Patient 12/04/2016 1229     (approximate)  I have reviewed the triage vital signs and the nursing notes.   HISTORY  Chief Complaint Rectal Bleeding  The patient is very hard of hearing which makes history more difficult but he has no history of dementia according to his family.   HPI Derek Novak is a 82 y.o. male with a past medical history that includes a hemorrhagic stroke 2 years ago but no prior history of GI bleeding who presents from his nursing facility with complaint of acute onset rectal bleeding this morning.  Staff noticed that he was having dark and bloody stools of a significant volume.  He initially complained of some tenderness in his lower abdomen but denied that to me.  He denies fever/chills, chest pain, shortness of breath, nausea, vomiting, diarrhea.  The symptoms were reportedly acute in onset, severe, and nothing makes them better nor worse.  Past Medical History:  Diagnosis Date  . Aortic valvar stenosis   . Atrial fibrillation ([place])   . Back pain   . Cancer ([place])    skin  . CHF (congestive heart failure) ([place])   . GERD (gastroesophageal reflux disease)   . Glaucoma   . Hyperlipidemia   . Hypertension   . Kidney stone   . Osteoarthritis   . Stroke [facility]) October 2015    Patient Active Problem List   Diagnosis Date Noted  . GI bleed 12/02/2016  . Atrial fibrillation ([place]) 04/25/2015  . Chronic systolic heart failure ([place]) 04/25/2015    Past Surgical History:  Procedure Laterality Date  . CATARACT EXTRACTION    . CHOLECYSTECTOMY    . PTCA Right Feb 2016   right thigh  . TONSILLECTOMY    . TRANSURETHRAL RESECTION OF PROSTATE      Prior to Admission medications   Medication Sig Start Date End Date Taking? Authorizing Provider  acetaminophen (TYLENOL) 500 MG tablet Take 1,000 mg by  mouth 2 (two) times daily.    Yes Historical Provider, MD  aspirin 81 MG tablet Take 81 mg by mouth daily.   Yes Historical Provider, MD  azelastine (ASTELIN) 0.1 % nasal spray Place 1 spray into both nostrils 2 (two) times daily. Use in each nostril as directed   Yes Historical Provider, MD  benzonatate (TESSALON) 100 MG capsule Take 100 mg by mouth 3 (three) times daily as needed for cough.   Yes Historical Provider, MD  brimonidine (ALPHAGAN) 0.2 % ophthalmic solution Place 1 drop into both eyes 2 (two) times daily.   Yes Historical Provider, MD  cyanocobalamin 1000 MCG tablet Take 1,000 mcg by mouth daily.   Yes Historical Provider, MD  docusate sodium (COLACE) 100 MG capsule Take 100 mg by mouth 2 (two) times daily.   Yes Historical Provider, MD  levofloxacin (LEVAQUIN) 250 MG tablet Take 250 mg by mouth daily. For 10 days. 12/05/16  Yes Historical Provider, MD  loperamide (IMODIUM) 2 MG capsule Take 2 mg by mouth as needed for diarrhea or loose stools.   Yes Historical Provider, MD  losartan (COZAAR) 25 MG tablet Take 25 mg by mouth daily.    Yes Historical Provider, MD  metoprolol succinate (TOPROL-XL) 25 MG 24 hr tablet Take 12.5 mg by mouth 2 (two) times daily.   Yes Historical Provider, MD  Multiple Vitamins-Minerals (PRESERVISION AREDS 2) CAPS Take 1 capsule by mouth daily.  Yes Historical Provider, MD  omeprazole (PRILOSEC) 20 MG capsule Take 20 mg by mouth daily.    Yes Historical Provider, MD  potassium chloride (K-DUR) 10 MEQ tablet Take 10 mEq by mouth daily as needed (with furosemide).   Yes Historical Provider, MD  furosemide (LASIX) 40 MG tablet Take 40 mg by mouth daily as needed for fluid or edema.    Historical Provider, MD    Allergies Quinidine  Family History  Problem Relation Age of Onset  . Colon cancer Father     Social History Social History  Substance Use Topics  . Smoking status: Former Smoker    Quit date: 04/25/1975  . Smokeless tobacco: Never Used  .  Alcohol use No    Review of Systems Constitutional: No fever/chills Eyes: No visual changes. ENT: No sore throat. Cardiovascular: Denies chest pain. Respiratory: Denies shortness of breath. Gastrointestinal: Dark and bloody stools starting today.  Prior abdominal pain, now resolved.  No nausea, no vomiting.  No diarrhea.  No constipation. Genitourinary: Negative for dysuria. Musculoskeletal: Negative for back pain. Skin: Negative for rash. Neurological: Negative for headaches, focal weakness or numbness.  10-point ROS otherwise negative.  ____________________________________________   PHYSICAL EXAM:  VITAL SIGNS: ED Triage Vitals  Enc Vitals Group     BP 12/09/2016 1130 (!) 156/70     Pulse Rate 12/23/2016 1130 (!) 59     Resp 12/13/2016 1131 18     Temp 12/20/2016 1131 97.9 F (36.6 C)     Temp Source 12/19/2016 1131 Oral     SpO2 12/19/2016 1130 100 %     Weight 11/29/2016 1131 170 lb (77.1 kg)     Height 12/17/2016 1131 5\' 11"  (1.803 m)     Head Circumference --      Peak Flow --      Pain Score 12/17/2016 1137 5     Pain Loc --      Pain Edu? --      Excl. in [place]? --     Constitutional: Alert and oriented. Well appearing and in no acute distress. Eyes: Conjunctivae are normal. PERRL. EOMI. Head: Atraumatic. Nose: No congestion/rhinnorhea. Mouth/Throat: Mucous membranes are moist.  Oropharynx non-erythematous. Neck: No stridor.  No meningeal signs.   Cardiovascular: Normal rate, regular rhythm. Good peripheral circulation. Grossly normal heart sounds. Respiratory: Normal respiratory effort.  No retractions. Lungs CTAB. Gastrointestinal: Soft and nontender even to deep palpation. No distention.  Rectal:  Normal external exam.  Melena on digital exam, strongly hemepositive (QC passed), chaperone present throughout exam Musculoskeletal: No lower extremity tenderness nor edema. No gross deformities of extremities. Neurologic:  Normal speech and language. No gross focal neurologic  deficits are appreciated.  Skin:  Skin is warm, dry and intact. No rash noted. Psychiatric: Mood and affect are normal. Speech and behavior are normal.  ____________________________________________   LABS (all labs ordered are listed, but only abnormal results are displayed)  Labs Reviewed  COMPREHENSIVE METABOLIC PANEL - Abnormal; Notable for the following:       Result Value   Glucose, Bld 122 (*)    Calcium 8.8 (*)    Total Protein 6.2 (*)    ALT 13 (*)    All other components within normal limits  CBC - Abnormal; Notable for the following:    Hemoglobin 12.7 (*)    HCT 39.4 (*)    RDW 15.6 (*)    Platelets 130 (*)    All other components within normal limits  LIPASE,  BLOOD  APTT  PROTIME-INR  POC OCCULT BLOOD, ED  TYPE AND SCREEN   ____________________________________________  EKG  None - EKG not ordered by ED physician ____________________________________________  RADIOLOGY   No results found.  ____________________________________________   PROCEDURES  Procedure(s) performed:   Procedures   Critical Care performed: No ____________________________________________   INITIAL IMPRESSION / ASSESSMENT AND PLAN / ED COURSE  Pertinent labs & imaging results that were available during my care of the patient were reviewed by me and considered in my medical decision making (see chart for details).  New onset melanotic stool not on blood thinners with history of hemorrhagic stroke.  Hemoglobin is normal, vital signs are stable.  Diverticular bleed versus neoplastic process is most likely.  The patient has no abdominal tenderness to palpation and there is no indication for acute/emergent imaging in the emergency department.  I will admit him for GI evaluation and further hospitalist management.    ____________________________________________  FINAL CLINICAL IMPRESSION(S) / ED DIAGNOSES  Final diagnoses:  Melena  Congestive heart failure, unspecified  congestive heart failure chronicity, unspecified congestive heart failure type ([place])  Bilateral hearing loss, unspecified hearing loss type  Rectal bleeding     MEDICATIONS GIVEN DURING THIS VISIT:  Medications  pantoprazole (PROTONIX) 80 mg in sodium chloride 0.9 % 100 mL IVPB (not administered)  pantoprazole (PROTONIX) 80 mg in sodium chloride 0.9 % 250 mL (0.32 mg/mL) infusion (not administered)  pantoprazole (PROTONIX) injection 40 mg (not administered)  morphine 4 MG/ML injection 1 mg (1 mg Intravenous Given 12/26/2016 1348)     NEW OUTPATIENT MEDICATIONS STARTED DURING THIS VISIT:  New Prescriptions   No medications on file    Modified Medications   No medications on file    Discontinued Medications   ATORVASTATIN (LIPITOR) 40 MG TABLET    Take 40 mg by mouth daily.   FINASTERIDE (PROSCAR) 5 MG TABLET    Take 5 mg by mouth daily.     Note:  This document was prepared using Dragon voice recognition software and may include unintentional dictation errors.    Hinda Kehr, MD 12/26/2016 [phone]

## 2016-12-16 NOTE — ED Notes (Signed)
This RN called admitting MD. Verbalized test results and MD verbalized she will be changing admission orders to a floor unit and no longer ICU.

## 2016-12-17 DIAGNOSIS — K625 Hemorrhage of anus and rectum: Secondary | ICD-10-CM

## 2016-12-17 DIAGNOSIS — K921 Melena: Secondary | ICD-10-CM

## 2016-12-17 LAB — BASIC METABOLIC PANEL
ANION GAP: 7 (ref 5–15)
BUN: 20 mg/dL (ref 6–20)
CALCIUM: 8.6 mg/dL — AB (ref 8.9–10.3)
CO2: 22 mmol/L (ref 22–32)
Chloride: 107 mmol/L (ref 101–111)
Creatinine, Ser: 1.4 mg/dL — ABNORMAL HIGH (ref 0.61–1.24)
GFR, EST AFRICAN AMERICAN: 49 mL/min — AB (ref >60)
GFR, EST NON AFRICAN AMERICAN: 42 mL/min — AB (ref >60)
Glucose, Bld: 152 mg/dL — ABNORMAL HIGH (ref 65–99)
POTASSIUM: 4.7 mmol/L (ref 3.5–5.1)
SODIUM: 136 mmol/L (ref 135–145)

## 2016-12-17 LAB — HEMOGLOBIN
HEMOGLOBIN: 11.6 g/dL — AB (ref 13.0–18.0)
HEMOGLOBIN: 12.7 g/dL — AB (ref 13.0–18.0)

## 2016-12-17 LAB — CBC
HEMATOCRIT: 37.5 % — AB (ref 40.0–52.0)
HEMOGLOBIN: 12.4 g/dL — AB (ref 13.0–18.0)
MCH: 28.6 pg (ref 26.0–34.0)
MCHC: 33.1 g/dL (ref 32.0–36.0)
MCV: 86.6 fL (ref 80.0–100.0)
Platelets: 139 10*3/uL — ABNORMAL LOW (ref 150–440)
RBC: 4.33 MIL/uL — AB (ref 4.40–5.90)
RDW: 15.8 % — ABNORMAL HIGH (ref 11.5–14.5)
WBC: 16.4 10*3/uL — AB (ref 3.8–10.6)

## 2016-12-17 LAB — MRSA PCR SCREENING: MRSA BY PCR: NEGATIVE

## 2016-12-17 NOTE — Evaluation (Signed)
Physical Therapy Evaluation Patient Details Name: Derek Novak MRN: [id number] DOB: [birth year] Today's Date: 12/17/2016   History of Present Illness  82 y/o male here with GI bleed, HGB has been relatively stable.   Clinical Impression  Pt did well with PT and though he has some fatigue with the effort of circumambulating the nurses' station he showed good confidence and safety and generally was not too far from his baseline.  Pt (and family) report that he looks like he could go back to his ALF w/o too much issues and agree that if he continues to improve as he has thus far he likely will not need HHPT when he does d/c.     Follow Up Recommendations No PT follow up;Home health PT (per progress, likely will not need PT f/u)    Equipment Recommendations       Recommendations for Other Services       Precautions / Restrictions Precautions Precautions: Fall Restrictions Weight Bearing Restrictions: No      Mobility  Bed Mobility Overal bed mobility: Modified Independent             General bed mobility comments: Pt needing only light use of rails to get himself up to sitting  Transfers Overall transfer level: Modified independent Equipment used: Rolling walker (2 wheeled)             General transfer comment: Pt able to get to standing w/o direct assist, reliant on the walker for balance but generally confident and safe.  Ambulation/Gait Ambulation/Gait assistance: Supervision;Min guard Ambulation Distance (Feet): 200 Feet Assistive device: Rolling walker (2 wheeled)       General Gait Details: Pt is able to ambulate with consistent cadence and good relative confidence and safety.  He had some fatigue with the effort, but O2 remained in the 90s on room air and generally he did well.   Stairs            Wheelchair Mobility    Modified Rankin (Stroke Patients Only)       Balance Overall balance assessment: No apparent balance deficits (not  formally assessed)                                           Pertinent Vitals/Pain Pain Assessment: No/denies pain    Home Living Family/patient expects to be discharged to:: Assisted living               Home Equipment: Walker - 4 wheels      Prior Function Level of Independence: Independent with assistive device(s)         Comments: Pt apparently does a lot of walking at [place] but recently has been weak and they have been bringing him meals, etc     Hand Dominance        Extremity/Trunk Assessment   Upper Extremity Assessment Upper Extremity Assessment: Generalized weakness (age appropriate limitations)    Lower Extremity Assessment Lower Extremity Assessment: Generalized weakness       Communication   Communication: HOH  Cognition Arousal/Alertness: Awake/alert Behavior During Therapy: WFL for tasks assessed/performed Overall Cognitive Status: Within Functional Limits for tasks assessed                      General Comments      Exercises     Assessment/Plan    PT  Assessment Patient needs continued PT services  PT Problem List Decreased activity tolerance;Decreased mobility;Decreased safety awareness;Decreased knowledge of use of DME;Decreased strength;Cardiopulmonary status limiting activity          PT Treatment Interventions DME instruction;Gait training;Stair training;Functional mobility training;Therapeutic activities;Therapeutic exercise;Balance training    PT Goals (Current goals can be found in the Care Plan section)  Acute Rehab PT Goals Patient Stated Goal: go back to Home Place PT Goal Formulation: With patient Time For Goal Achievement: 12/31/16 Potential to Achieve Goals: Good    Frequency Min 2X/week   Barriers to discharge        Co-evaluation               End of Session Equipment Utilized During Treatment: Gait belt Activity Tolerance: Patient tolerated treatment well Patient  left: with chair alarm set;with call bell/phone within reach;with family/visitor present           Time: 0922-0949 PT Time Calculation (min) (ACUTE ONLY): 27 min   Charges:   PT Evaluation $PT Eval Low Complexity: 1 Procedure     PT G CodesKreg Shropshire, DPT 12/17/2016, 11:36 AM

## 2016-12-17 NOTE — Progress Notes (Signed)
[facility] [place] NAME: Derek Novak    MRN#:  [id number]  DATE OF BIRTH:  Jan 30, 1922  SUBJECTIVE:  Hospital Day: 1 day Derek Novak is a 82 y.o. male presenting with Rectal Bleeding .   Overnight events: no overnight events Interval Events: no more bleeding states "im hungry!"  REVIEW OF SYSTEMS:  CONSTITUTIONAL: No fever, fatigue or weakness.  EYES: No blurred or double vision.  EARS, NOSE, AND THROAT: No tinnitus or ear pain.  RESPIRATORY: No cough, shortness of breath, wheezing or hemoptysis.  CARDIOVASCULAR: No chest pain, orthopnea, edema.  GASTROINTESTINAL: No nausea, vomiting, diarrhea or abdominal pain.  GENITOURINARY: No dysuria, hematuria.  ENDOCRINE: No polyuria, nocturia,  HEMATOLOGY: No anemia, easy bruising or bleeding SKIN: No rash or lesion. MUSCULOSKELETAL: No joint pain or arthritis.   NEUROLOGIC: No tingling, numbness, weakness.  PSYCHIATRY: No anxiety or depression.   DRUG ALLERGIES:   Allergies  Allergen Reactions  . Quinidine Other (See Comments)    Altered mental status    VITALS:  Blood pressure 114/69, pulse 89, temperature 97.5 F (36.4 C), temperature source Oral, resp. rate 18, height 5\' 11"  (1.803 m), weight 77.1 kg (170 lb), SpO2 98 %.  PHYSICAL EXAMINATION:  VITAL SIGNS: Vitals:   12/17/16 0922 12/17/16 1317  BP: (!) 122/51 114/69  Pulse:  89  Resp:  18  Temp:  97.5 F (36.4 C)   GENERAL:82 y.o.male currently in no acute distress.  HEAD: Normocephalic, atraumatic.  EYES: Pupils equal, round, reactive to light. Extraocular muscles intact. No scleral icterus.  MOUTH: Moist mucosal membrane. Dentition intact. No abscess noted.  EAR, NOSE, THROAT: Clear without exudates. No external lesions.  NECK: Supple. No thyromegaly. No nodules. No JVD.  PULMONARY: Clear to ascultation, without wheeze rails or rhonci. No use of accessory muscles, Good respiratory effort. good air entry  bilaterally CHEST: Nontender to palpation.  CARDIOVASCULAR: S1 and S2. Irregular rate and rhythm. No murmurs, rubs, or gallops. No edema. Pedal pulses 2+ bilaterally.  GASTROINTESTINAL: Soft, nontender, nondistended. No masses. Positive bowel sounds. No hepatosplenomegaly.  MUSCULOSKELETAL: No swelling, clubbing, or edema. Range of motion full in all extremities.  NEUROLOGIC: Cranial nerves II through XII are intact. No gross focal neurological deficits. Sensation intact. Reflexes intact.  SKIN: No ulceration, lesions, rashes, or cyanosis. Skin warm and dry. Turgor intact.  PSYCHIATRIC: Mood, affect within normal limits. The patient is awake, alert and oriented x 3. Insight, judgment intact.      LABORATORY PANEL:   CBC  Recent Labs Lab 12/17/16 0719  WBC 16.4*  HGB 12.4*  HCT 37.5*  PLT 139*   ------------------------------------------------------------------------------------------------------------------  Chemistries   Recent Labs Lab 12/15/2016 1133 12/17/16 0719  NA 136 136  K 4.6 4.7  CL 104 107  CO2 25 22  GLUCOSE 122* 152*  BUN 16 20  CREATININE 1.01 1.40*  CALCIUM 8.8* 8.6*  AST 22  --   ALT 13*  --   ALKPHOS 107  --   BILITOT 1.1  --    ------------------------------------------------------------------------------------------------------------------  Cardiac Enzymes No results for input(s): TROPONINI in the last 168 hours. ------------------------------------------------------------------------------------------------------------------  RADIOLOGY:  Nm Gi Blood Loss  Result Date: 12/03/2016 CLINICAL DATA:  Gastrointestinal bleeding since this morning. EXAM: NUCLEAR MEDICINE GASTROINTESTINAL BLEEDING SCAN TECHNIQUE: Sequential abdominal images were obtained following intravenous administration of Tc-36m labeled red blood cells. RADIOPHARMACEUTICALS:  20.222 mCi Tc-31m in-vitro labeled red cells. COMPARISON:  Bleeding scan 01/30/2010. FINDINGS: No evidence  of  GI bleeding is identified. IMPRESSION: Negative for active GI bleed. Electronically Signed   By: Inge Rise M.D.   On: 12/13/2016 18:30    EKG:   Orders placed or performed in visit on 10/21/14  . EKG 12-Lead    ASSESSMENT AND PLAN:   Derek Novak is a 82 y.o. male presenting with Rectal Bleeding . Admitted 12/12/2016 : Day #: 1 day 1. Upper gi bleed: bleeding scan negative, hemoglobin stable, gi input appreciated, continue protonix, advance diet 2. Chronic atrial fibrillation: rate controlled 3. Ischemic cardiomyopathy: stable 4. htn essential: stable  Anticipate dc tomorrow, Pt eval - no follow up  All the records are reviewed and case discussed with Care Management/Social Workerr. Management plans discussed with the patient, family and they are in agreement.  CODE STATUS: dnr TOTAL TIME TAKING CARE OF THIS PATIENT: 28 minutes.   POSSIBLE D/C IN 1DAYS, DEPENDING ON CLINICAL CONDITION.   Hower,  Karenann Cai.D on 12/17/2016 at 3:07 PM  Between 7am to 6pm - Pager - [phone]  After 6pm: House Pager: - [facility]  Office  [phone]  CC: Primary care physician; Adin Hector, MD

## 2016-12-17 NOTE — Consult Note (Signed)
Derek Lame, MD [facility]  [address], [place], [place] [address] Phone: [phone] Fax : [phone]  Consultation  Referring Provider:    Dr. Tressia Miners Primary Care Physician:  Derek Hector, MD Primary Gastroenterologist:  None         Reason for Consultation:     Hematochezia  Date of Admission:  12/12/2016 Date of Consultation:  12/17/2016         HPI:   Derek Novak is a 82 y.o. male who was admitted with rectal bleeding. The patient has a history of congestive heart failure with an ejection fraction of 20%. The patient also has aortic stenosis. The patient was found to have rectal bleeding and underwent a bleeding scan in the ER which was negative. The patient's hemoglobin was around 12 and his hemoglobin has been stable. The patient reports that he had only one bowel movement overnight. The nurses confirm this. The patient was reporting that he had abdominal pain with black stools. In the ER he had 2 episodes of dark bloody stools. This morning the patient reports that he is at his residence and in [place] and is not aware that he is at the hospital in [place]. When speaking to nurses the nursing staff reports that yesterday he was able to answer these questions correctly. Patient was presently taking aspirin but has not been on Coumadin for his A. fib for last 2 years due to an intracranial bleed.  Past Medical History:  Diagnosis Date  . Aortic valvar stenosis   . Atrial fibrillation ([place])   . Back pain   . Cancer ([place])    skin  . CHF (congestive heart failure) ([place])   . GERD (gastroesophageal reflux disease)   . Glaucoma   . Hyperlipidemia   . Hypertension   . Kidney stone   . Osteoarthritis   . Stroke [facility]) October 2015    Past Surgical History:  Procedure Laterality Date  . CATARACT EXTRACTION    . CHOLECYSTECTOMY    . PTCA Right Feb 2016   right thigh  . TONSILLECTOMY    . TRANSURETHRAL RESECTION OF PROSTATE      Prior to Admission  medications   Medication Sig Start Date End Date Taking? Authorizing Provider  acetaminophen (TYLENOL) 500 MG tablet Take 1,000 mg by mouth 2 (two) times daily.    Yes Historical Provider, MD  aspirin 81 MG tablet Take 81 mg by mouth daily.   Yes Historical Provider, MD  azelastine (ASTELIN) 0.1 % nasal spray Place 1 spray into both nostrils 2 (two) times daily. Use in each nostril as directed   Yes Historical Provider, MD  benzonatate (TESSALON) 100 MG capsule Take 100 mg by mouth 3 (three) times daily as needed for cough.   Yes Historical Provider, MD  brimonidine (ALPHAGAN) 0.2 % ophthalmic solution Place 1 drop into both eyes 2 (two) times daily.   Yes Historical Provider, MD  cyanocobalamin 1000 MCG tablet Take 1,000 mcg by mouth daily.   Yes Historical Provider, MD  docusate sodium (COLACE) 100 MG capsule Take 100 mg by mouth 2 (two) times daily.   Yes Historical Provider, MD  levofloxacin (LEVAQUIN) 250 MG tablet Take 250 mg by mouth daily. For 10 days. 12/05/16  Yes Historical Provider, MD  loperamide (IMODIUM) 2 MG capsule Take 2 mg by mouth as needed for diarrhea or loose stools.   Yes Historical Provider, MD  losartan (COZAAR) 25 MG tablet Take 25 mg by mouth daily.  Yes Historical Provider, MD  metoprolol succinate (TOPROL-XL) 25 MG 24 hr tablet Take 12.5 mg by mouth 2 (two) times daily.   Yes Historical Provider, MD  Multiple Vitamins-Minerals (PRESERVISION AREDS 2) CAPS Take 1 capsule by mouth daily.   Yes Historical Provider, MD  omeprazole (PRILOSEC) 20 MG capsule Take 20 mg by mouth daily.    Yes Historical Provider, MD  potassium chloride (K-DUR) 10 MEQ tablet Take 10 mEq by mouth daily as needed (with furosemide).   Yes Historical Provider, MD  furosemide (LASIX) 40 MG tablet Take 40 mg by mouth daily as needed for fluid or edema.    Historical Provider, MD    Family History  Problem Relation Age of Onset  . Colon cancer Father      Social History  Substance Use Topics    . Smoking status: Former Smoker    Quit date: 04/25/1975  . Smokeless tobacco: Never Used  . Alcohol use No    Allergies as of 12/13/2016 - Review Complete 12/22/2016  Allergen Reaction Noted  . Quinidine Other (See Comments) 04/25/2015    Review of Systems:    All systems reviewed and negative except where noted in HPI.   Physical Exam:  Vital signs in last 24 hours: Temp:  [97.9 F (36.6 C)-98.2 F (36.8 C)] 98.2 F (36.8 C) (12/19 0601) Pulse Rate:  [59-114] 103 (12/19 0601) Resp:  [11-20] 18 (12/19 0601) BP: (103-165)/(56-91) 103/56 (12/19 0601) SpO2:  [94 %-100 %] 94 % (12/19 0601) Weight:  [170 lb (77.1 kg)] 170 lb (77.1 kg) (12/18 1131) Last BM Date: 12/08/2016 General:   Pleasant, cooperative in NAD Head:  Normocephalic and atraumatic. Eyes:   No icterus.   Conjunctiva pink. PERRLA. Ears:  Normal auditory acuity. Neck:  Supple; no masses or thyroidomegaly Lungs: Respirations even and unlabored. Lungs clear to auscultation bilaterally.   No wheezes, crackles, or rhonchi.  Heart:  Regular rate and rhythm;  Without murmur, clicks, rubs or gallops Abdomen:  Soft, nondistended, nontender. Normal bowel sounds. No appreciable masses or hepatomegaly.  No rebound or guarding.  Rectal:  Not performed. Msk:  Symmetrical without gross deformities.    Extremities:  Without edema, cyanosis or clubbing. Neurologic:  Confused;  grossly normal neurologically. Skin:  Intact without significant lesions or rashes. Cervical Nodes:  No significant cervical adenopathy. Psych:  Alert and cooperative. Normal affect.  LAB RESULTS:  Recent Labs  12/14/2016 1133 12/09/2016 1951 12/17/16 0006  WBC 6.9  --   --   HGB 12.7* 12.8* 12.7*  HCT 39.4*  --   --   PLT 130*  --   --    BMET  Recent Labs  12/13/2016 1133  NA 136  K 4.6  CL 104  CO2 25  GLUCOSE 122*  BUN 16  CREATININE 1.01  CALCIUM 8.8*   LFT  Recent Labs  12/04/2016 1133  PROT 6.2*  ALBUMIN 3.8  AST 22  ALT 13*   ALKPHOS 107  BILITOT 1.1   PT/INR  Recent Labs  12/09/2016 1133  LABPROT 14.7  INR 1.14    STUDIES: Nm Gi Blood Loss  Result Date: 12/20/2016 CLINICAL DATA:  Gastrointestinal bleeding since this morning. EXAM: NUCLEAR MEDICINE GASTROINTESTINAL BLEEDING SCAN TECHNIQUE: Sequential abdominal images were obtained following intravenous administration of Tc-15m labeled red blood cells. RADIOPHARMACEUTICALS:  20.222 mCi Tc-26m in-vitro labeled red cells. COMPARISON:  Bleeding scan 01/30/2010. FINDINGS: No evidence of GI bleeding is identified. IMPRESSION: Negative for active GI bleed. Electronically Signed  By: Inge Rise M.D.   On: 12/12/2016 18:30      Impression / Plan:   Derek Novak is a 82 y.o. y/o male with who was admitted with black stools. The patient had a stable hemoglobin. There is no further sign of any GI bleeding. The patient's family was told the options of treating him with a PPI versus undergoing a upper endoscopy. They have agreed to treat the patient medically instead of undergoing any advanced procedures at this time. They were explained the risks of any further bleeding including hypertension and death versus the risks of doing the procedure and are okay with not proceeding with the endoscopy at this time.  Thank you for involving me in the care of this patient.      LOS: 1 day   Derek Lame, MD  12/17/2016, 7:38 AM   Note: This dictation was prepared with Dragon dictation along with smaller phrase technology. Any transcriptional errors that result from this process are unintentional.

## 2016-12-18 LAB — HEMOGLOBIN: HEMOGLOBIN: 8.7 g/dL — AB (ref 13.0–18.0)

## 2016-12-18 LAB — CBC
HCT: 36.5 % — ABNORMAL LOW (ref 40.0–52.0)
Hemoglobin: 12 g/dL — ABNORMAL LOW (ref 13.0–18.0)
MCH: 28.9 pg (ref 26.0–34.0)
MCHC: 32.9 g/dL (ref 32.0–36.0)
MCV: 87.8 fL (ref 80.0–100.0)
PLATELETS: 134 10*3/uL — AB (ref 150–440)
RBC: 4.15 MIL/uL — AB (ref 4.40–5.90)
RDW: 16.1 % — AB (ref 11.5–14.5)
WBC: 15 10*3/uL — ABNORMAL HIGH (ref 3.8–10.6)

## 2016-12-30 NOTE — Progress Notes (Signed)
[facility] responded to a code blue page for a Pt in Rm 226. When [facility] arrived, the code blue cancelled, Pt status adjusted to DNR. Pt's family was not present, [place] offered presence and support for the health providers.    25-Dec-2016 0600  Clinical Encounter Type  Visited With Patient;Health care provider  Visit Type Initial;Code;Trauma  Referral From Nurse  Consult/Referral To Chaplain  Spiritual Encounters  Spiritual Needs Prayer;Other (Comment)

## 2016-12-30 NOTE — Progress Notes (Signed)
Primary nurse rounded on pt. Pt was found to be unresponsive and not breathing.A code blue was initially called and then canceled right after due to the pt not  being a DNR. Dr. Owens Shark and code team did respond. Dr. Owens Shark assessed the  pt and called the time of death at 0540.  Primary nurse notified [facility] and informed Dr. Marcille Blanco. [facility] notified.   Primary nurse notified pt son and the change in status and requested him to come to the hospital. Pt son and daughter and law came to the hospital. Primary nurse spoke to them and informed them of the passing away of Mr. Derek Novak.

## 2016-12-30 NOTE — Discharge Summary (Signed)
   [facility] [place] NAME: Derek Novak    MR#:  [id number]  DATE OF BIRTH:  1922-04-25  DATE OF ADMISSION:  12/11/2016 ADMITTING PHYSICIAN: Gladstone Lighter, MD  DATE OF DISCHARGE: 12/21/16  PRIMARY CARE PHYSICIAN: Tama High III, MD    ADMISSION DIAGNOSIS:  Melena [K92.1] Rectal bleeding [K62.5] Rectal bleed [K62.5] Congestive heart failure, unspecified congestive heart failure chronicity, unspecified congestive heart failure type ([place]) [I50.9] Bilateral hearing loss, unspecified hearing loss type [H91.93]  DISCHARGE DIAGNOSIS:  Active Problems:   GI bleed   Melena   Rectal bleeding Vtach arrest  SECONDARY DIAGNOSIS:   Past Medical History:  Diagnosis Date  . Aortic valvar stenosis   . Atrial fibrillation ([place])   . Back pain   . Cancer ([place])    skin  . CHF (congestive heart failure) ([place])   . GERD (gastroesophageal reflux disease)   . Glaucoma   . Hyperlipidemia   . Hypertension   . Kidney stone   . Osteoarthritis   . Stroke [facility]) October 2015    HOSPITAL COURSE:  Derek Novak  is a 83 y.o. male admitted 12/12/2016 with chief complaint Rectal Bleeding . Please see H&P performed by Gladstone Lighter, MD for further information. Patient presented with GI bleeding. His hemoglobin remained stable, underwent bleeding scan - negative, evaluated by GI who recommended conservative management. He was kept in the hospital overnight to observe. 5:40am 2016-12-20 He went into Vtach cardiac arrest. Being DNR Code was not performed  DISCHARGE CONDITIONS:   expired  CONSULTS OBTAINED:  Treatment Team:  [name], MD  Hower,  Karenann Cai.D on 12/21/2016 at 10:22 AM  Between 7am to 6pm - Pager - [phone]  After 6pm go to [email or web address] - Proofreader  [facility]  Office  [phone]  CC: Primary care physician; Adin Hector, MD

## 2016-12-30 DEATH — deceased

## 2018-03-02 IMAGING — NM NM GI BLOOD LOSS
2 series · 12 of 12 positions shown · non-contrast
Comparison: Bleeding scan 01/30/2010.

CLINICAL DATA: Gastrointestinal bleeding since this morning.

EXAM:
NUCLEAR MEDICINE GASTROINTESTINAL BLEEDING SCAN
TECHNIQUE: Sequential abdominal images were obtained following intravenous
administration of Jc-33m labeled red blood cells.
RADIOPHARMACEUTICALS:  20.222 mCi Jc-33m in-vitro labeled red cells.

[Series 1000: hour 1 gi bleed · 4.80mm/px · 6 of 60 frames shown]
[frame 6/60]
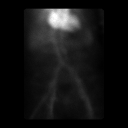
[frame 16/60]
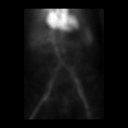
[frame 26/60]
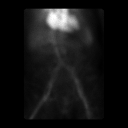
[frame 36/60]
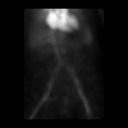
[frame 46/60]
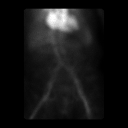
[frame 56/60]
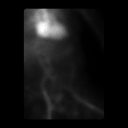

[Series 1000: gi bleed hour 2 · 4.80mm/px · 6 of 60 frames shown]
[frame 6/60]
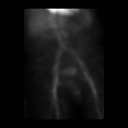
[frame 16/60]
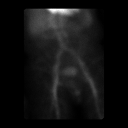
[frame 26/60]
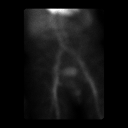
[frame 36/60]
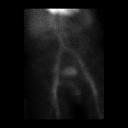
[frame 46/60]
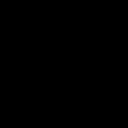
[frame 56/60]
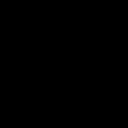

[12 of 12 positions shown; findings below may reference images not displayed]

FINDINGS: No evidence of GI bleeding is identified.
IMPRESSION: Negative for active GI bleed.
